# Patient Record
Sex: Male | Born: 1990 | Race: White | Hispanic: No | Marital: Single | Smoking: Never smoker
Health system: Southern US, Community
[De-identification: ages and names within clinical notes are randomized; demographics above are authoritative.]

## PROBLEM LIST (undated history)

## (undated) DIAGNOSIS — J45909 Unspecified asthma, uncomplicated: Secondary | ICD-10-CM

## (undated) HISTORY — PX: ABDOMINAL SURGERY: SHX537

---
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
## 2012-03-11 ENCOUNTER — Emergency Department: Payer: Self-pay | Admitting: Emergency Medicine

## 2012-03-23 LAB — URINALYSIS, COMPLETE
Blood: NEGATIVE
Hyaline Cast: 1
Ketone: NEGATIVE
Nitrite: NEGATIVE
Ph: 7 (ref 4.5–8.0)
Protein: NEGATIVE
RBC,UR: 1 /HPF (ref 0–5)
Specific Gravity: 1.015 (ref 1.003–1.030)

## 2012-03-23 LAB — CBC
HCT: 44.6 % (ref 40.0–52.0)
MCH: 31.1 pg (ref 26.0–34.0)
MCHC: 35.6 g/dL (ref 32.0–36.0)
MCV: 87 fL (ref 80–100)
Platelet: 191 10*3/uL (ref 150–440)
RDW: 12.8 % (ref 11.5–14.5)

## 2012-03-23 LAB — DRUG SCREEN, URINE
Amphetamines, Ur Screen: NEGATIVE (ref ?–1000)
Benzodiazepine, Ur Scrn: NEGATIVE (ref ?–200)
Cocaine Metabolite,Ur ~~LOC~~: NEGATIVE (ref ?–300)
MDMA (Ecstasy)Ur Screen: NEGATIVE (ref ?–500)
Tricyclic, Ur Screen: NEGATIVE (ref ?–1000)

## 2012-03-23 LAB — COMPREHENSIVE METABOLIC PANEL
Albumin: 4.4 g/dL (ref 3.8–5.6)
Anion Gap: 8 (ref 7–16)
Bilirubin,Total: 0.7 mg/dL (ref 0.2–1.0)
Calcium, Total: 9.1 mg/dL (ref 9.0–10.7)
Co2: 28 mmol/L (ref 21–32)
Creatinine: 0.86 mg/dL (ref 0.60–1.30)
EGFR (African American): >60
SGPT (ALT): 17 U/L (ref 12–78)
Total Protein: 8.2 g/dL (ref 6.4–8.6)

## 2012-03-23 LAB — ETHANOL
Ethanol %: <0.003 % (ref 0.000–0.080)
Ethanol: <3 mg/dL

## 2012-03-23 LAB — ACETAMINOPHEN LEVEL: Acetaminophen: <2 ug/mL

## 2012-03-23 LAB — SALICYLATE LEVEL: Salicylates, Serum: <1.7 mg/dL

## 2012-03-24 LAB — LIPID PANEL
Cholesterol: 142 mg/dL (ref 0–200)
HDL Cholesterol: 48 mg/dL (ref 40–60)
Ldl Cholesterol, Calc: 74 mg/dL (ref 0–100)
Triglycerides: 100 mg/dL (ref 0–135)
VLDL Cholesterol, Calc: 20 mg/dL (ref 5–40)

## 2012-03-25 ENCOUNTER — Inpatient Hospital Stay: Payer: Self-pay | Admitting: Psychiatry

## 2012-03-27 LAB — URINALYSIS, COMPLETE
Bacteria: NONE SEEN
Bilirubin,UR: NEGATIVE
Ketone: NEGATIVE
Leukocyte Esterase: NEGATIVE
Protein: NEGATIVE
RBC,UR: 1 /HPF (ref 0–5)
Specific Gravity: 1.006 (ref 1.003–1.030)
Squamous Epithelial: NONE SEEN
WBC UR: 2 /HPF (ref 0–5)

## 2014-05-31 ENCOUNTER — Emergency Department: Payer: Self-pay | Admitting: Emergency Medicine

## 2014-05-31 LAB — URINALYSIS, COMPLETE
BILIRUBIN, UR: NEGATIVE
BLOOD: NEGATIVE
Bacteria: NONE SEEN
Glucose,UR: NEGATIVE mg/dL (ref 0–75)
Ketone: NEGATIVE
NITRITE: NEGATIVE
Ph: 6 (ref 4.5–8.0)
Protein: NEGATIVE
RBC,UR: 50 /HPF (ref 0–5)
Specific Gravity: 1.024 (ref 1.003–1.030)
Squamous Epithelial: <1
WBC UR: 191 /HPF (ref 0–5)

## 2014-05-31 LAB — GC/CHLAMYDIA PROBE AMP

## 2014-09-09 NOTE — H&P (Signed)
PATIENT NAME:  Aaron Trevino, Deston MR#:  [id number] DATE OF BIRTH:  07/12/1990  DATE OF ADMISSION:  03/23/2012  IDENTIFYING INFORMATION AND CHIEF COMPLAINT: A 24 year old man brought in by the police.   CHIEF COMPLAINT: "I don't need to be here."   HISTORY OF PRESENT ILLNESS: Information obtained from the patient and from the chart. The nursing evaluation done when the patient first came to the emergency room states that he called 911 telling them that he was going to kill himself. It states the patient said that he was walking out into traffic, wanting to be hit by cars, that he felt depressed and confused. That he felt like he did not understand how life was working and wanted to die. The patient in the interview with me tells me that this is all untrue and that his mother made it up. He says that his mother just wants him to be apart from his girlfriend because she has some dislike of his girlfriend. He says that he had been crying earlier because he had problems and was feeling nervous. He does not think that he is very depressed. He totally denies having any suicidal ideation. He denies having any psychotic symptoms. He states that his sleep is irregular and chaotic. His appetite is fine. He denies that he is abusing any drugs. He is not currently working because he has a history of felony charges.   PAST PSYCHIATRIC HISTORY: He states that when he was much younger he had what he calls "temper tantrums" and a lot of outbursts. He was treated with Vyvanse for a while. He says when he did take it it helped him. He has not been on any medication for mood or anxiety otherwise. Never been in a psychiatric hospital. Denies ever trying to kill himself. Says he has gotten in fights when he was in jail but otherwise has not been physically aggressive.   SOCIAL HISTORY: The patient says he is living part time with his mother, part time with his aunt. He has a girlfriend who is [redacted] weeks pregnant. He is not currently  working. Says he has felony charges and is on probation.   MEDICAL HISTORY: No significant known ongoing medical problems. He said that he had mild asthma when he was a child but does not have problems from it anymore.   FAMILY HISTORY: Evidently there is an extensive family history of mental illness including schizophrenia.    SUBSTANCE ABUSE HISTORY: The patient says that he drinks alcohol only infrequently. Denies that he is abusing any other drugs. Denies any recent drug abuse.   CURRENT MEDICATIONS: None.   ALLERGIES: Clonidine.   REVIEW OF SYSTEMS: Says that his mood is currently angry. Denies suicidal or homicidal ideation. Denies hallucinations. Denies panic attacks. Is not reporting any acute physical symptoms at all.   MENTAL STATUS EXAMINATION: Somewhat agitated young man. Looks his stated age. Cooperative with the exam for the most part, although he frequently goes on tangents to insist that he does not need to be in the hospital. Eye contact is poor. Psychomotor activity is fidgety. Speech is normal in rate, tone, and volume. Affect is irritable, agitated, anxious. Mood is stated as being upset. Thoughts are a little bit tangential, not grossly bizarre. No evidence of delusional thinking. Denies auditory or visual hallucinations. He denies any suicidal or homicidal ideation. His intelligence appears to be probably average. Judgment and insight impaired.   PHYSICAL EXAMINATION:   GENERAL: The patient is a healthy-appearing young man.  Multiple tattoos but no acute skin lesions other than a small scab on his lip. He has pupils that are equal and reactive. Face is symmetric. Other than the small scab on the left side of his lip, no other facial abnormalities. Oropharynx looks normal.   EXTREMITIES: Has full range of motion at all extremities. Normal gait. Strength and reflexes normal throughout.   NEUROLOGICAL: Cranial nerves symmetric and intact.   LUNGS: Clear to auscultation.    HEART: Regular rate and rhythm.   ABDOMEN: Soft, nontender, normal bowel sounds.   VITAL SIGNS: Temperature 98.1, pulse 62, respirations 18, blood pressure 96/53.   LABORATORY RESULTS: His drug screen is negative. Alcohol undetectable. No significant abnormalities detected.   ASSESSMENT: This is a 24 year old man who presented earlier in the day with reports of depression, suicidal ideation, confusion. At this point when I am interviewing him he is denying all of this. He is very bent on wanting to get out of the hospital and blaming his problems on others. Not really clear what is exactly going on here, whether he does have a mood disorder or not. Involuntary paperwork is in place. The patient requires admission for evaluation of safety and symptoms.   TREATMENT PLAN: Admit to Psychiatry. P.r.n. medication for agitation and anxiety. Try and get collateral information if possible. Include him in groups and activities on the unit, work on ascertaining his need for further treatment. Defer other medication at this point.   DIAGNOSIS PRINCIPLE AND PRIMARY:  AXIS I: Depression, not otherwise specified.   SECONDARY DIAGNOSES:  AXIS I: No other.  AXIS II: Deferred.  AXIS III: No diagnosis.  AXIS IV: Moderate to severe from having felony charges, lack of resources.  AXIS V: Functioning at time of evaluation is 40.   ____________________________ Audery Amel, MD jtc:vtd D: 03/23/2012 21:26:28 ET T: 03/24/2012 06:49:41 ET JOB#: [id number]  cc: Audery Amel, MD, <Dictator> Audery Amel MD ELECTRONICALLY SIGNED 03/24/2012 9:41

## 2014-09-09 NOTE — Discharge Summary (Signed)
PATIENT NAME:  Aaron Trevino, HORNSTEIN MR#:  [id number] DATE OF BIRTH:  [birth year]  DATE OF ADMISSION:  03/25/2012 DATE OF DISCHARGE:  03/27/2012  HOSPITAL COURSE: See dictated History and Physical for details of admission. This 24 year old man was admitted through the Emergency Room. He had presented initially with depressed mood and possible suicidal ideation. When he was informed that he was not able to leave the Emergency Room he became agitated. He spent the weekend in the Emergency Room because of his agitation and resistance to treatment. After the weekend he eventually calmed down and became more reasonable. He has been admitted to the psychiatry ward where he has not engaged in any dangerous or aggressive or violent behavior. He states that he still has had a depressed mood, which he relates to a lot of  the stress going on in his life, but has denied any suicidal ideation. He has not engaged in any suicidal behavior. He does not show signs of being psychotic. He was treated with Seroquel for several days in an attempt to greatly calm down his mood. Because of his lack of finances he will not be able to afford this medication long-term. This has been discontinued and he was switched over to Celexa 20 mg a day for treatment of depression and he has tolerated that well. He has been educated about the treatment of depression including therapy and staying on medication. He has been supported in staying off of drugs and alcohol. He will be followed up at [facility] where an outpatient appointment is being made. Medically he really has had no complaints. A urinalysis suggested the possibility of an infection and he is being treated with Septra, which will continue for two more days.   DISCHARGE MEDICATIONS:  1. Celexa 20 mg per day.  2. Septra DS 1 tablet twice a day for two more days.   LABORATORY RESULTS: Admission labs showed a chemistry panel with an elevated glucose at 120 but it was not a fasting level.  Alkaline phosphatase low at 96. Alcohol undetectable. TSH normal at 2.55. Drug screen negative. Lipid panel shows a cholesterol total 142, triglycerides 100, HDL 48. CBC was normal. Urinalysis showed 15 white blood cells to 1 red blood cell, no bacteria, and trace leukocyte esterase. The salicylates and acetaminophen were negative. Follow-up urinalysis today is negative.   MENTAL STATUS EXAM: Casually dressed and groomed young man who looks his stated age. Appears to be in no acute distress. Eye contact still diminished. Speech quiet but easy to understand and articulate. Affect still a little bit blunted. Mood stated as being fine. Thoughts are lucid with no indication of loosening of associations or delusional thinking. Denies auditory or visual hallucinations. Denies suicidal or homicidal ideation. Judgment and insight are improved. Baseline intelligence probably normal. Short and long-term memory grossly intact. No sign of disorganized or psychotic thinking. Denies any suicidal thoughts at all.   DISPOSITION: Discharged back home and follow up with [facility].   DIAGNOSIS PRINCIPLE AND PRIMARY:  AXIS I: Depression, not otherwise specified.   SECONDARY DIAGNOSES:  AXIS I: No further.   AXIS II: Deferred.   AXIS III: Urinary tract infection.   AXIS IV: Moderate to severe from legal charges, being out of work, having no stable place to stay.   AXIS V: Functioning at time of discharge: 57.   ____________________________ Audery Amel, MD jtc:bjt D: 03/27/2012 12:27:47 ET T: 03/27/2012 12:40:05 ET JOB#: [id number]  cc: Audery Amel, MD, <Dictator> Audery Amel MD  ELECTRONICALLY SIGNED 03/27/2012 16:37

## 2014-09-09 NOTE — Consult Note (Signed)
Brief Consult Note: Diagnosis: depression nos.   Patient was seen by consultant.   Recommend further assessment or treatment.   Comments: Psychiatry: Patient seen. Depression nos. Behavior problems. Under IVC. Has been admitted to psychiatry. Waiting for behavior to calm down a bit for the safety of the unit.  Electronic Signatures: Audery Amellapacs, Dayani Winbush T (MD)  (Signed [phone]-Nov-13 20:56)  Authored: Brief Consult Note   Last Updated: 01-Nov-13 20:56 by Audery Amellapacs, Senica Crall T (MD)

## 2016-05-08 ENCOUNTER — Emergency Department
Admission: EM | Admit: 2016-05-08 | Discharge: 2016-05-08 | Disposition: A | Discharge status: Home / Self Care | Payer: Self-pay | Attending: Emergency Medicine | Admitting: Emergency Medicine

## 2016-05-08 DIAGNOSIS — Z5321 Procedure and treatment not carried out due to patient leaving prior to being seen by health care provider: Secondary | ICD-10-CM | POA: Insufficient documentation

## 2016-09-26 ENCOUNTER — Ambulatory Visit (INDEPENDENT_AMBULATORY_CARE_PROVIDER_SITE_OTHER): Payer: BC Managed Care – PPO

## 2016-09-26 ENCOUNTER — Encounter: Payer: Self-pay | Admitting: Emergency Medicine

## 2016-09-26 ENCOUNTER — Ambulatory Visit
Admission: EM | Admit: 2016-09-26 | Discharge: 2016-09-26 | Disposition: A | Discharge status: Home / Self Care | Payer: BC Managed Care – PPO | Attending: Family Medicine | Admitting: Family Medicine

## 2016-09-26 DIAGNOSIS — S20212A Contusion of left front wall of thorax, initial encounter: Secondary | ICD-10-CM

## 2016-09-26 DIAGNOSIS — W19XXXA Unspecified fall, initial encounter: Secondary | ICD-10-CM

## 2016-09-26 HISTORY — DX: Unspecified asthma, uncomplicated: J45.909

## 2016-09-26 MED ORDER — TRAMADOL HCL 50 MG PO TABS: 50.0000 mg | ORAL_TABLET | Freq: Three times a day (TID) | ORAL | 0 refills | Status: DC | PRN

## 2016-09-26 NOTE — Discharge Instructions (Signed)
Take medication as prescribed. Rest. Drink plenty of fluids. Deep breaths as discussed.  ° °Follow up with your primary care physician this week as needed. Return to Urgent care for new or worsening concerns.  ° °

## 2016-09-26 NOTE — ED Provider Notes (Signed)
[facility] ____________________________________________  Time seen: Approximately 5:13 PM  I have reviewed the triage vital signs and the nursing notes.   HISTORY  Chief Complaint Rib Pain (left side)   HPI Harold Willis is a 26 y.o. male presenting for evaluation of left lateral rib pain post injury that occurred 2 days ago. Patient reports Friday evening he was at a concert and "crowd surfing "and reports that he was dropped. Patient states when he fell on his left side his left elbow hit into his left ribs causing onset of pain. Denies other injuries. Denies head injury or loss consciousness. Reports he was drinking alcohol, denies intoxication. Reports pain is present with direct palpation or overhead movement. denies any resting pain. States no pain prior to injury. Denies chest pain. Denies shortness of breath. States occasionally does have some pain with very deep inhalation. Denies other pain with breathing. Denies previous rib fractures are listed issues. Reports has had history of asthma and has been well controlled. Steady recent fevers, cough, congestion or sickness. Again denies any other pain or injury. Reports pain is primarily at night when trying to lie down and get comfortable. States pain currently mild.  Denies chest pain, shortness of breath, abdominal pain, dysuria,or rash. Denies recent sickness. Denies recent antibiotic use.    Past Medical History:  Diagnosis Date  . Asthma     There are no active problems to display for this patient.   History reviewed. No pertinent surgical history.   No current facility-administered medications for this encounter.   Current Outpatient Prescriptions:  .  albuterol (PROVENTIL HFA;VENTOLIN HFA) 108 (90 Base) MCG/ACT inhaler, Inhale 2 puffs into the lungs every 4 (four) hours as needed for wheezing or shortness of breath., Disp: , Rfl:  .  fluticasone furoate-vilanterol (BREO ELLIPTA) 200-25 MCG/INH AEPB,  Inhale 1 puff into the lungs daily., Disp: , Rfl:  .  tamsulosin (FLOMAX) 0.4 MG CAPS capsule, Take 0.4 mg by mouth daily., Disp: , Rfl:  .  traMADol (ULTRAM) 50 MG tablet, Take 1 tablet (50 mg total) by mouth every 8 (eight) hours as needed (Do not drive or operate machinery while taking as can cause drowsiness.)., Disp: 12 tablet, Rfl: 0  Allergies Doxycycline and Suprax [cefixime]  History reviewed. No pertinent family history.  Social History Social History  Substance Use Topics  . Smoking status: Never Smoker  . Smokeless tobacco: Never Used  . Alcohol use Yes    Review of Systems Constitutional: No fever/chills Eyes: No visual changes. Cardiovascular: Denies chest pain. Respiratory: Denies shortness of breath. Gastrointestinal: No abdominal pain.  No nausea, no vomiting.  No diarrhea.  No constipation. No hematuria, melena or abnormal colored stools.  Genitourinary: Negative for dysuria. Musculoskeletal: Negative for back pain. Skin: Negative for rash. Neurological: Negative for headaches, focal weakness or numbness.  ____________________________________________   PHYSICAL EXAM:  VITAL SIGNS: ED Triage Vitals  Enc Vitals Group     BP 09/26/16 1527 132/83     Pulse Rate 09/26/16 1527 74     Resp 09/26/16 1527 16     Temp 09/26/16 1527 97.8 F (36.6 C)     Temp Source 09/26/16 1527 Oral     SpO2 09/26/16 1527 98 %     Weight 09/26/16 1524 220 lb (99.8 kg)     Height 09/26/16 1524 5\' 9"  (1.753 m)     Head Circumference --      Peak Flow --      Pain Score  09/26/16 1525 6     Pain Loc --      Pain Edu? --      Excl. in GC? --     Constitutional: Alert and oriented. Well appearing and in no acute distress. ENT      Head: Normocephalic and atraumatic. Cardiovascular: Normal rate, regular rhythm. Grossly normal heart sounds.  Good peripheral circulation. Respiratory: Normal respiratory effort without tachypnea nor retractions. Breath sounds are clear and equal  bilaterally. No wheezes, rales, rhonchi. Gastrointestinal: Soft and nontender. No distention. Normal Bowel sounds. No CVA tenderness. Musculoskeletal:  Nontender with normal range of motion in all extremities. No midline cervical, thoracic or lumbar tenderness to palpation.  Except: Left lateral mid ribs mild to moderate tenderness to direct palpation, minimal ecchymosis, no swelling, no palpable rib fracture, no crepitus, no other tenderness noted. Neurologic:  Normal speech and language. No gross focal neurologic deficits are appreciated. Speech is normal. No gait instability.  Skin:  Skin is warm, dry. Psychiatric: Mood and affect are normal. Speech and behavior are normal. Patient exhibits appropriate insight and judgment  ___________________________________________   LABS (all labs ordered are listed, but only abnormal results are displayed)  Labs Reviewed - No data to display ____________________________________________  RADIOLOGY  No results found. ____________________________________________   PROCEDURES Procedures    INITIAL IMPRESSION / ASSESSMENT AND PLAN / ED COURSE  Pertinent labs & imaging results that were available during my care of the patient were reviewed by me and considered in my medical decision making (see chart for details).  Well-appearing patient. No acute distress. Left lateral rib pain fully reproducible by direct palpation. Left rib x-ray negative per radiologist. Discussed the patient suspect rib contusion. Encouraged supportive care, over-the-counter ibuprofen, rest, ice. Avoidance of strenuous activity. Encourage deep breaths throughout the day. Also will treat patient with when necessary tramadol as needed for breakthrough pain.Discussed indication, risks and benefits of medications with patient.   [place] controlled substance database reviewed for last one year, and no controlled medications documented in last one year.   Discussed follow up  with Primary care physician this week. Discussed follow up and return parameters including no resolution or any worsening concerns. Patient verbalized understanding and agreed to plan.   ____________________________________________   FINAL CLINICAL IMPRESSION(S) / ED DIAGNOSES  Final diagnoses:  Rib contusion, left, initial encounter     Discharge Medication List as of 09/26/2016  4:03 PM    START taking these medications   Details  traMADol (ULTRAM) 50 MG tablet Take 1 tablet (50 mg total) by mouth every 8 (eight) hours as needed (Do not drive or operate machinery while taking as can cause drowsiness.)., Starting Mon 09/26/2016, Print        Note: This dictation was prepared with Dragon dictation along with smaller phrase technology. Any transcriptional errors that result from this process are unintentional.         Renford Dills, NP 09/30/16 1325

## 2016-09-26 NOTE — ED Triage Notes (Signed)
Patient c/o left sided rib pain that started Friday night.  Patient states that he fell at a concert on Friday.

## 2017-08-06 ENCOUNTER — Emergency Department (HOSPITAL_COMMUNITY)
Admission: EM | Admit: 2017-08-06 | Discharge: 2017-08-06 | Disposition: A | Discharge status: Home / Self Care | Payer: Self-pay | Attending: Emergency Medicine | Admitting: Emergency Medicine

## 2017-08-06 ENCOUNTER — Encounter (HOSPITAL_COMMUNITY): Payer: Self-pay

## 2017-08-06 DIAGNOSIS — Z5321 Procedure and treatment not carried out due to patient leaving prior to being seen by health care provider: Secondary | ICD-10-CM | POA: Insufficient documentation

## 2017-08-06 DIAGNOSIS — R110 Nausea: Secondary | ICD-10-CM | POA: Insufficient documentation

## 2017-08-06 DIAGNOSIS — R000 Tachycardia, unspecified: Secondary | ICD-10-CM | POA: Insufficient documentation

## 2017-08-06 NOTE — ED Notes (Signed)
[facility] Expected date:  Expected time:  Means of arrival:  Comments: 27 yo Nausea, tachycardia

## 2017-08-06 NOTE — ED Provider Notes (Signed)
By the time I got to the patient's bed, nurse informed me that pt had received 500 cc bag of fluid from EMS and felt better, and eloped.   Derwood KaplanNanavati, Kalifa Cadden, MD 08/06/17 [phone]

## 2017-08-06 NOTE — ED Notes (Addendum)
Pt attempted to leave through EMS bay. Security and [employer] was called. Pt came back to charge RN desk where pt IV was removed. Pt denies pain and sts that the medicine and fluid he was given by EMS helped his nausea. Pt was advised that the physician is ready to see him, but pt refused to be seen. Pt left ED with security. Pt ambulates with no difficulty and in NAD

## 2017-08-06 NOTE — ED Notes (Addendum)
Patient kept asking to leave and I explained to the patient the doctor would be there shortly. Patient began to walk down hallway and I asked patient to come back and he continued to walk with her IV still in his arm. Patient continued walking to the EMS bay and I paged security to come get him. Security escorted him back in and Susan,RN was able to remove the IV. Patient left without being seen by the doctor. Patient would not stay. Patient left with out medical advice.

## 2017-08-06 NOTE — ED Notes (Signed)
Patient observed walking out of the emergency department with his IV still in place. This RN politely asked patient to return to his bed so that the IV could be taken out. Patient did not listen to this RN and security was called to the area. Patient continued to walk away from this RN and security greeted patient outside in the EMS bay. Patient complied and came back inside to have his IV taken out by the charge nurse. Patient then left AMA.

## 2017-08-06 NOTE — ED Triage Notes (Signed)
Patient arrived via [facility] from home. Patient was out drinking lastnight and woke up this morning with non stop vomiting with a little blood tinged per patien. Patient was tachy initially 130-140 bpm. Initial bp- 104/60. Initial cbg-60. 99% on room air. RR-18. 500 NSgiven and 25 grams of D-10 given per ems and 4 mg of zofran given per ems. HR is now 120 and CBG is up 211. Bp-130/84. 18g in left bicep. Patient has not vomited since the zofran.

## 2017-12-20 ENCOUNTER — Emergency Department
Admission: EM | Admit: 2017-12-20 | Discharge: 2017-12-20 | Discharge status: Against Medical Advice | Payer: Self-pay | Attending: Emergency Medicine | Admitting: Emergency Medicine

## 2017-12-20 ENCOUNTER — Other Ambulatory Visit: Payer: Self-pay

## 2017-12-20 DIAGNOSIS — L02612 Cutaneous abscess of left foot: Secondary | ICD-10-CM | POA: Insufficient documentation

## 2017-12-20 DIAGNOSIS — Z5321 Procedure and treatment not carried out due to patient leaving prior to being seen by health care provider: Secondary | ICD-10-CM | POA: Insufficient documentation

## 2017-12-20 NOTE — ED Triage Notes (Signed)
Pt arrives with abscess/boil to L top of foot at base of 4th toe. States been there a few weeks. No fevers or drainage. Alert, oriented, ambulatory. No redness noted around site.

## 2017-12-20 NOTE — ED Notes (Signed)
Patient made comment to house keeping that he wanted to be d/c. PA went into room for assessment and room was empty. Patient left without being seen by PA

## 2018-03-26 ENCOUNTER — Emergency Department
Admission: EM | Admit: 2018-03-26 | Discharge: 2018-03-26 | Disposition: A | Discharge status: Home / Self Care | Payer: Self-pay | Attending: Emergency Medicine | Admitting: Emergency Medicine

## 2018-03-26 ENCOUNTER — Encounter: Payer: Self-pay | Admitting: Intensive Care

## 2018-03-26 ENCOUNTER — Emergency Department: Payer: Self-pay

## 2018-03-26 ENCOUNTER — Other Ambulatory Visit: Payer: Self-pay

## 2018-03-26 DIAGNOSIS — B353 Tinea pedis: Secondary | ICD-10-CM | POA: Insufficient documentation

## 2018-03-26 DIAGNOSIS — L02612 Cutaneous abscess of left foot: Secondary | ICD-10-CM | POA: Insufficient documentation

## 2018-03-26 DIAGNOSIS — F17210 Nicotine dependence, cigarettes, uncomplicated: Secondary | ICD-10-CM | POA: Insufficient documentation

## 2018-03-26 DIAGNOSIS — L0291 Cutaneous abscess, unspecified: Secondary | ICD-10-CM

## 2018-03-26 MED ORDER — CLINDAMYCIN PHOSPHATE 300 MG/2ML IJ SOLN
INTRAMUSCULAR | Status: AC
  Administered 2018-03-26: 600 mg via INTRAMUSCULAR
  Filled 2018-03-26: qty 4

## 2018-03-26 MED ORDER — CLINDAMYCIN PHOSPHATE 600 MG/4ML IJ SOLN
600.0000 mg | Freq: Once | INTRAMUSCULAR | Status: AC
  Administered 2018-03-26: 600 mg via INTRAMUSCULAR
  Filled 2018-03-26: qty 4

## 2018-03-26 MED ORDER — TERBINAFINE HCL 250 MG PO TABS: 250.0000 mg | ORAL_TABLET | Freq: Every day | ORAL | 0 refills | Status: AC

## 2018-03-26 MED ORDER — CLINDAMYCIN HCL 300 MG PO CAPS: 300.0000 mg | ORAL_CAPSULE | Freq: Four times a day (QID) | ORAL | 0 refills | Status: AC

## 2018-03-26 MED ORDER — LIDOCAINE HCL (PF) 1 % IJ SOLN
5.0000 mL | Freq: Once | INTRAMUSCULAR | Status: AC
  Administered 2018-03-26: 5 mL via INTRADERMAL
  Filled 2018-03-26: qty 5

## 2018-03-26 MED ORDER — OXYCODONE-ACETAMINOPHEN 5-325 MG PO TABS
1.0000 | ORAL_TABLET | ORAL | Status: DC | PRN
  Administered 2018-03-26: 1 via ORAL
  Filled 2018-03-26: qty 1
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
## 2018-03-26 NOTE — ED Notes (Addendum)
See triage note  States he was seen about 1 month ago for blister on left foot  States area popped was placed on meds  Then 2 days ago developed more pain

## 2018-11-30 ENCOUNTER — Telehealth: Payer: Self-pay

## 2018-11-30 ENCOUNTER — Other Ambulatory Visit: Payer: BC Managed Care – PPO

## 2018-11-30 ENCOUNTER — Ambulatory Visit: Payer: Self-pay

## 2018-11-30 DIAGNOSIS — Z20822 Contact with and (suspected) exposure to covid-19: Secondary | ICD-10-CM

## 2018-11-30 NOTE — Telephone Encounter (Signed)
See telephone note.

## 2018-11-30 NOTE — Telephone Encounter (Signed)
Glenda with [facility] Dept., request COVID 19 test for symptoms. Scheduled for today.

## 2018-12-06 LAB — NOVEL CORONAVIRUS, NAA: SARS-CoV-2, NAA: NOT DETECTED

## 2019-04-13 ENCOUNTER — Other Ambulatory Visit: Payer: Self-pay

## 2019-04-13 ENCOUNTER — Encounter: Payer: Self-pay | Admitting: Emergency Medicine

## 2019-04-13 ENCOUNTER — Emergency Department
Admission: EM | Admit: 2019-04-13 | Discharge: 2019-04-13 | Disposition: A | Discharge status: Home / Self Care | Payer: Self-pay | Attending: Student in an Organized Health Care Education/Training Program | Admitting: Student in an Organized Health Care Education/Training Program

## 2019-04-13 DIAGNOSIS — M7918 Myalgia, other site: Secondary | ICD-10-CM

## 2019-04-13 DIAGNOSIS — F17210 Nicotine dependence, cigarettes, uncomplicated: Secondary | ICD-10-CM | POA: Insufficient documentation

## 2019-04-13 DIAGNOSIS — Y93I9 Activity, other involving external motion: Secondary | ICD-10-CM | POA: Diagnosis not present

## 2019-04-13 DIAGNOSIS — M791 Myalgia, unspecified site: Secondary | ICD-10-CM | POA: Diagnosis not present

## 2019-04-13 DIAGNOSIS — Y92410 Unspecified street and highway as the place of occurrence of the external cause: Secondary | ICD-10-CM | POA: Insufficient documentation

## 2019-04-13 DIAGNOSIS — Y999 Unspecified external cause status: Secondary | ICD-10-CM | POA: Diagnosis not present

## 2019-04-13 MED ORDER — IBUPROFEN 600 MG PO TABS: 600.0000 mg | ORAL_TABLET | Freq: Three times a day (TID) | ORAL | 0 refills | Status: DC | PRN

## 2019-04-13 MED ORDER — CYCLOBENZAPRINE HCL 10 MG PO TABS: 10.0000 mg | ORAL_TABLET | Freq: Three times a day (TID) | ORAL | 0 refills | Status: DC | PRN

## 2019-04-13 MED ORDER — TRAMADOL HCL 50 MG PO TABS: 50.0000 mg | ORAL_TABLET | Freq: Four times a day (QID) | ORAL | 0 refills | Status: AC | PRN

## 2019-04-13 NOTE — Discharge Instructions (Addendum)
Follow discharge care instruction take medication as directed.  Be advised of drowsy effect of these medications. °
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
## 2019-04-14 ENCOUNTER — Encounter: Payer: Self-pay | Admitting: Emergency Medicine

## 2019-04-14 ENCOUNTER — Emergency Department: Payer: Self-pay

## 2019-04-14 ENCOUNTER — Other Ambulatory Visit: Payer: Self-pay

## 2019-04-14 ENCOUNTER — Emergency Department
Admission: EM | Admit: 2019-04-14 | Discharge: 2019-04-14 | Disposition: A | Discharge status: Home / Self Care | Payer: Self-pay | Attending: Emergency Medicine | Admitting: Emergency Medicine

## 2019-04-14 DIAGNOSIS — Y9389 Activity, other specified: Secondary | ICD-10-CM | POA: Diagnosis not present

## 2019-04-14 DIAGNOSIS — M79622 Pain in left upper arm: Secondary | ICD-10-CM | POA: Insufficient documentation

## 2019-04-14 DIAGNOSIS — F17210 Nicotine dependence, cigarettes, uncomplicated: Secondary | ICD-10-CM | POA: Insufficient documentation

## 2019-04-14 DIAGNOSIS — M25512 Pain in left shoulder: Secondary | ICD-10-CM | POA: Diagnosis not present

## 2019-04-14 DIAGNOSIS — Y999 Unspecified external cause status: Secondary | ICD-10-CM | POA: Diagnosis not present

## 2019-04-14 DIAGNOSIS — Y92410 Unspecified street and highway as the place of occurrence of the external cause: Secondary | ICD-10-CM | POA: Insufficient documentation

## 2019-04-14 MED ORDER — KETOROLAC TROMETHAMINE 60 MG/2ML IM SOLN
30.0000 mg | Freq: Once | INTRAMUSCULAR | Status: AC
  Administered 2019-04-14: 19:00:00 30 mg via INTRAMUSCULAR
  Filled 2019-04-14: qty 2

## 2019-04-14 NOTE — ED Notes (Signed)
Patient transported to X-ray 
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
## 2019-08-16 IMAGING — DX DG FOOT COMPLETE 3+V*L*
3 series · 3 of 3 positions shown · non-contrast
Comparison: None.

CLINICAL DATA: 27-year-old male with left foot pain and
discoloration at the base of the fourth and fifth digits.

EXAM:
LEFT FOOT - COMPLETE 3+ VIEW

[foot ap]
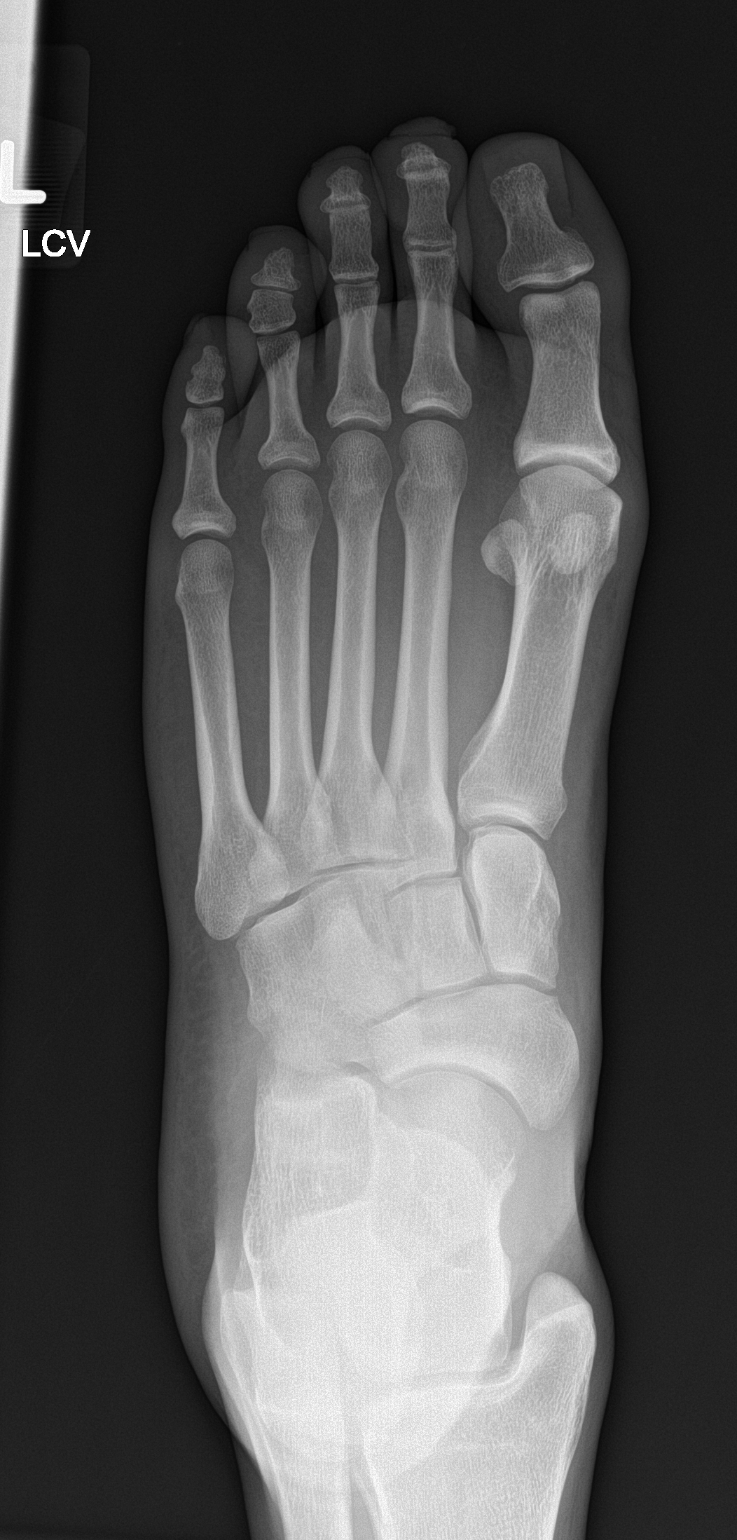

[foot obl]
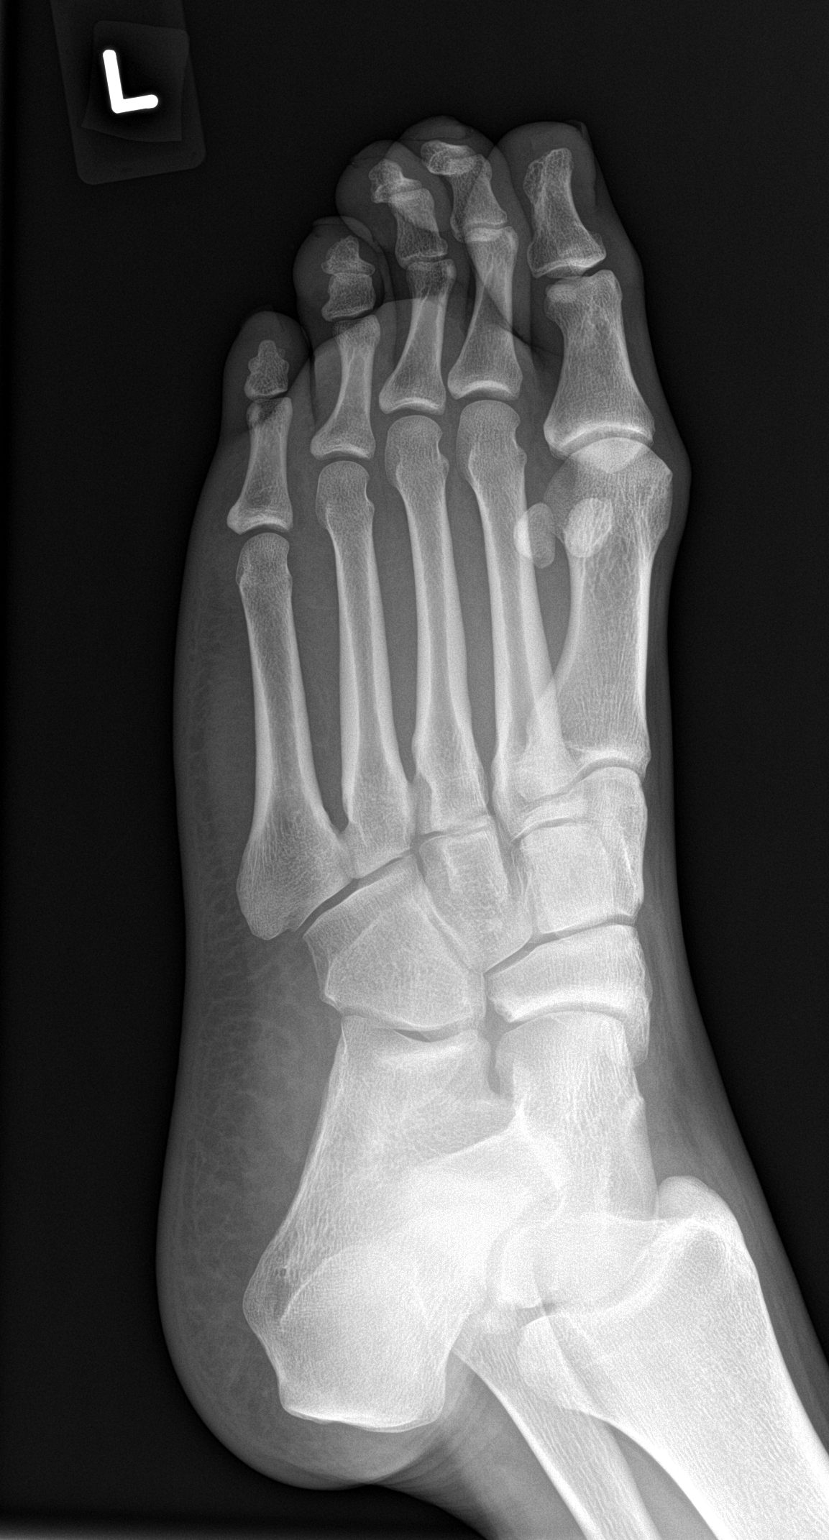

[foot lat]
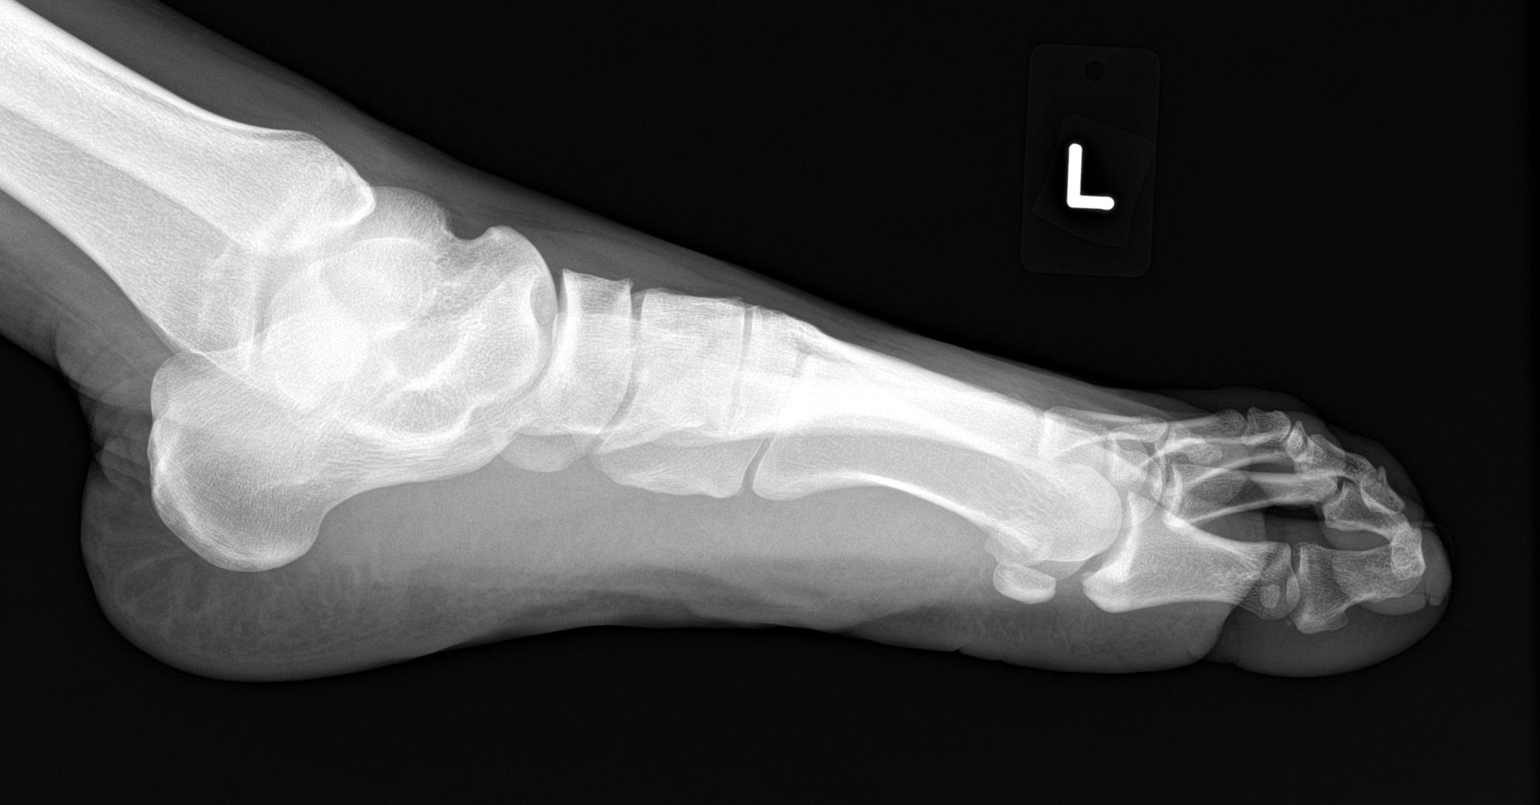

[3 of 3 positions shown; findings below may reference images not displayed]

FINDINGS: There is no evidence of fracture or dislocation. There is no
evidence of arthropathy or other focal bone abnormality. Soft
tissues are unremarkable. No radiopaque retained foreign body.
IMPRESSION: Negative.
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
## 2021-06-30 ENCOUNTER — Other Ambulatory Visit: Payer: Self-pay | Admitting: Family Medicine

## 2021-06-30 DIAGNOSIS — R1032 Left lower quadrant pain: Secondary | ICD-10-CM

## 2021-07-15 ENCOUNTER — Ambulatory Visit: Payer: Self-pay

## 2021-10-14 ENCOUNTER — Ambulatory Visit (INDEPENDENT_AMBULATORY_CARE_PROVIDER_SITE_OTHER): Payer: 59 | Admitting: Urology

## 2021-10-14 VITALS — BP 143/93 | HR 89 | Ht 69.0 in | Wt 250.0 lb

## 2021-10-14 DIAGNOSIS — N419 Inflammatory disease of prostate, unspecified: Secondary | ICD-10-CM

## 2021-10-14 DIAGNOSIS — N411 Chronic prostatitis: Secondary | ICD-10-CM

## 2021-10-14 LAB — URINALYSIS, COMPLETE
Bilirubin, UA: NEGATIVE
Glucose, UA: NEGATIVE
Ketones, UA: NEGATIVE
Leukocytes,UA: NEGATIVE
Nitrite, UA: NEGATIVE
Protein,UA: NEGATIVE
RBC, UA: NEGATIVE
Specific Gravity, UA: 1.03 (ref 1.005–1.030)
Urobilinogen, Ur: 0.2 mg/dL (ref 0.2–1.0)
pH, UA: 6 (ref 5.0–7.5)

## 2021-10-14 LAB — MICROSCOPIC EXAMINATION: Bacteria, UA: NONE SEEN

## 2021-10-14 MED ORDER — TAMSULOSIN HCL 0.4 MG PO CAPS: 0.4000 mg | ORAL_CAPSULE | Freq: Every day | ORAL | 4 refills | Status: DC

## 2021-10-14 MED ORDER — MELOXICAM 7.5 MG PO TABS: 7.5000 mg | ORAL_TABLET | Freq: Every day | ORAL | 0 refills | Status: DC

## 2021-10-14 NOTE — Progress Notes (Signed)
10/14/2021 9:43 AM   [name] Aug 10, 1990 [id number]  Referring provider:  Marisue Ivan, MD [phone] [facility] [address] [facility] [place] [facility] [place],  [place] [address] Chief Complaint  Patient presents with   Prostatitis      HPI: Harold Willis is a 31 y.o.male who presents today for further evaluation of chronic prostatitis.   He has a history of STI that progressed into prostatitis.   He was last seen by urology in 2018 by Dr. Achilles Dunk. At time he had significant improvement in the orchialgia and voiding symptoms. He was using alpha-blocked and tolerated it well.   He reports today that he has a history of tightness in his prostate area that was ongoing and he followed up with Dr Achilles Dunk and was told his had prostatitis. He was on Flomax which had slight improvement. He had lower abdominal pain last month he was having diarrhea he did not have much urinary symptoms. He is most bothered by pressure and pain when he sits.   He reports split and weak stream as well as difficulty urinating.    PMH: Past Medical History:  Diagnosis Date   Asthma     Surgical History: No past surgical history on file.  Home Medications:  Allergies as of 10/14/2021       Reactions   Iodinated Contrast Media Hives, Swelling   Hives, itching and upper lip swelling    Other Hives, Rash   Doxycycline Hives   Suprax [cefixime] Hives        Medication List        Accurate as of Oct 14, 2021  9:43 AM. If you have any questions, ask your nurse or doctor.          STOP taking these medications    fluticasone furoate-vilanterol 200-25 MCG/INH Aepb Commonly known as: BREO ELLIPTA   tamsulosin 0.4 MG Caps capsule Commonly known as: FLOMAX   traMADol 50 MG tablet Commonly known as: Ultram       TAKE these medications    albuterol 108 (90 Base) MCG/ACT inhaler Commonly known as: VENTOLIN HFA Inhale 2 puffs into the lungs every 4 (four) hours as needed for wheezing or shortness of  breath.        Allergies:  Allergies  Allergen Reactions   Iodinated Contrast Media Hives and Swelling    Hives, itching and upper lip swelling     Other Hives and Rash   Doxycycline Hives   Suprax [Cefixime] Hives    Family History: No family history on file.  Social History:  reports that he has never smoked. He has never used smokeless tobacco. He reports current alcohol use. No history on file for drug use.   Physical Exam: BP (!) 143/93   Pulse 89   Ht 5\' 9"  (1.753 m)   Wt 250 lb (113.4 kg)   BMI 36.92 kg/m   Constitutional:  Alert and oriented, No acute distress. HEENT: [place] AT, moist mucus membranes.  Trachea midline, no masses. Cardiovascular: No clubbing, cyanosis, or edema. Respiratory: Normal respiratory effort, no increased work of breathing. Skin: No rashes, bruises or suspicious lesions. Neurologic: Grossly intact, no focal deficits, moving all 4 extremities. Psychiatric: Normal mood and affect.  Laboratory Data: Urinalysis Unremarkable   Assessment & Plan:    Chronic prostatitis  -  He had slight improvement on Flomax in the past. Will have him start back on this medication on a prn bases. We discussed the side effects of Flomax including retrograde  ejaculation.  - Will also have him take an anti-inflammatory; meloxicam when he is having flares, hold for GI distress - He can take both of these as needed.  - Flomax 0.4 mg as needed; prescribed  - Meloxicam  7.5mg   as needed; prescirbed  -If he fails to improve or episodes of pelvic pain and/or urinary symptoms increase in frequency, could consider pelvic floor therapy as there may be a component of pelvic floor dysfunction contributing to his symptoms based on his history -No evidence of infectious etiology, urinalysis negative -We did discuss dietary triggers as well today as there may also be a component of IC  F/u prn  I,Kailey Littlejohn,acting as a scribe for Vanna Scotland, MD.,have documented  all relevant documentation on the behalf of Vanna Scotland, MD,as directed by  Vanna Scotland, MD while in the presence of Vanna Scotland, MD.  I have reviewed the above documentation for accuracy and completeness, and I agree with the above.   Vanna Scotland, MD   [facility] [address], [address] [place], [place] [address] [phone]

## 2022-04-07 ENCOUNTER — Other Ambulatory Visit: Payer: Self-pay

## 2022-04-07 ENCOUNTER — Emergency Department
Admission: EM | Admit: 2022-04-07 | Discharge: 2022-04-07 | Disposition: A | Discharge status: Home / Self Care | Payer: Self-pay | Attending: Emergency Medicine | Admitting: Emergency Medicine

## 2022-04-07 ENCOUNTER — Encounter: Payer: Self-pay | Admitting: Emergency Medicine

## 2022-04-07 DIAGNOSIS — X58XXXA Exposure to other specified factors, initial encounter: Secondary | ICD-10-CM | POA: Insufficient documentation

## 2022-04-07 DIAGNOSIS — S3121XA Laceration without foreign body of penis, initial encounter: Secondary | ICD-10-CM | POA: Insufficient documentation

## 2022-04-07 MED ORDER — LIDOCAINE 3 % EX CREA: 1.0000 | TOPICAL_CREAM | Freq: Two times a day (BID) | CUTANEOUS | 0 refills | Status: AC | PRN
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
## 2022-04-23 ENCOUNTER — Emergency Department
Admission: EM | Admit: 2022-04-23 | Discharge: 2022-04-23 | Discharge status: Against Medical Advice | Payer: Self-pay | Attending: Emergency Medicine | Admitting: Emergency Medicine

## 2022-04-23 DIAGNOSIS — Z5321 Procedure and treatment not carried out due to patient leaving prior to being seen by health care provider: Secondary | ICD-10-CM | POA: Insufficient documentation

## 2022-04-23 DIAGNOSIS — Z041 Encounter for examination and observation following transport accident: Secondary | ICD-10-CM | POA: Insufficient documentation
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
## 2022-12-15 ENCOUNTER — Ambulatory Visit
Admission: RE | Admit: 2022-12-15 | Discharge: 2022-12-15 | Disposition: A | Discharge status: Home / Self Care | Payer: Managed Care, Other (non HMO) | Source: Ambulatory Visit | Attending: Physician Assistant | Admitting: Physician Assistant

## 2022-12-15 ENCOUNTER — Other Ambulatory Visit: Payer: Self-pay | Admitting: Physician Assistant

## 2022-12-15 DIAGNOSIS — M7989 Other specified soft tissue disorders: Secondary | ICD-10-CM

## 2022-12-15 DIAGNOSIS — M79604 Pain in right leg: Secondary | ICD-10-CM | POA: Insufficient documentation

## 2024-01-05 ENCOUNTER — Encounter: Payer: Self-pay | Admitting: Emergency Medicine

## 2024-01-05 ENCOUNTER — Ambulatory Visit
Admission: EM | Admit: 2024-01-05 | Discharge: 2024-01-05 | Disposition: A | Discharge status: Home / Self Care | Attending: Nurse Practitioner | Admitting: Nurse Practitioner

## 2024-01-05 ENCOUNTER — Other Ambulatory Visit: Payer: Self-pay

## 2024-01-05 DIAGNOSIS — N4889 Other specified disorders of penis: Secondary | ICD-10-CM

## 2024-01-05 DIAGNOSIS — N489 Disorder of penis, unspecified: Secondary | ICD-10-CM

## 2024-01-05 LAB — POCT URINE DIPSTICK
Bilirubin, UA: NEGATIVE
Blood, UA: NEGATIVE
Glucose, UA: NEGATIVE mg/dL
Ketones, POC UA: NEGATIVE mg/dL
Leukocytes, UA: NEGATIVE
Nitrite, UA: NEGATIVE
Protein Ur, POC: NEGATIVE mg/dL
Spec Grav, UA: 1.015 (ref 1.010–1.025)
Urobilinogen, UA: 0.2 U/dL
pH, UA: 6 (ref 5.0–8.0)

## 2024-01-05 MED ORDER — SULFAMETHOXAZOLE-TRIMETHOPRIM 800-160 MG PO TABS: 1.0000 | ORAL_TABLET | Freq: Two times a day (BID) | ORAL | 0 refills | Status: AC

## 2024-01-05 MED ORDER — MUPIROCIN 2 % EX OINT: 1.0000 | TOPICAL_OINTMENT | Freq: Two times a day (BID) | CUTANEOUS | 0 refills | Status: DC

## 2024-01-05 MED ORDER — FLUCONAZOLE 150 MG PO TABS
ORAL_TABLET | ORAL | 0 refills | Status: DC
Start: 2024-01-05 — End: 2024-01-23

## 2024-01-05 NOTE — Discharge Instructions (Addendum)
 Take medication as prescribed. You may take over-the-counter Tylenol or ibuprofen as needed for pain, fever, or general discomfort. Keep the genital area clean and dry.  Avoid excessive sweating or moisture while symptoms persist. Cleanse the genital area with unscented soap while symptoms persist. Recommend avoiding sexual intercourse until symptoms improve. As discussed, if symptoms fail to improve with this treatment, you may follow-up in this clinic or with your primary care physician for further evaluation. Follow-up as needed.

## 2024-01-05 NOTE — ED Provider Notes (Signed)
 [facility]    CSN: [id number] Arrival date & time: 01/05/24  1909      History   Chief Complaint Chief Complaint  Patient presents with   Skin Problem    HPI Dina Mobley is a 33 y.o. male.   The history is provided by the patient.   Reports no concern for STI/STD Past Medical History:  Diagnosis Date   Asthma     There are no active problems to display for this patient.   History reviewed. No pertinent surgical history.     Home Medications    Prior to Admission medications   Medication Sig Start Date End Date Taking? Authorizing Provider  albuterol (PROVENTIL HFA;VENTOLIN HFA) 108 (90 Base) MCG/ACT inhaler Inhale 2 puffs into the lungs every 4 (four) hours as needed for wheezing or shortness of breath.    [provider]  meloxicam  (MOBIC ) 7.5 MG tablet Take 1 tablet (7.5 mg total) by mouth daily. 10/14/21   Penne Knee, MD  tamsulosin  (FLOMAX ) 0.4 MG CAPS capsule Take 1 capsule (0.4 mg total) by mouth daily. 10/14/21   Penne Knee, MD    Family History History reviewed. No pertinent family history.  Social History Social History   Tobacco Use   Smoking status: Never   Smokeless tobacco: Never  Substance Use Topics   Alcohol use: Yes     Allergies   Iodinated contrast media, Other, Doxycycline, and Suprax [cefixime]   Review of Systems Review of Systems Per HPI  Physical Exam Triage Vital Signs ED Triage Vitals  Encounter Vitals Group     BP 01/05/24 1925 (!) 161/95     Girls Systolic BP Percentile --      Girls Diastolic BP Percentile --      Boys Systolic BP Percentile --      Boys Diastolic BP Percentile --      Pulse Rate 01/05/24 1925 (!) 102     Resp 01/05/24 1925 20     Temp 01/05/24 1925 98.6 F (37 C)     Temp Source 01/05/24 1925 Oral     SpO2 01/05/24 1925 96 %     Weight --      Height --      Head Circumference --      Peak Flow --      Pain Score 01/05/24 1923 1     Pain Loc --       Pain Education --      Exclude from Growth Chart --    No data found.  Updated Vital Signs BP (!) 161/95 (BP Location: Right Arm)   Pulse (!) 102   Temp 98.6 F (37 C) (Oral)   Resp 20   SpO2 96%   Visual Acuity Right Eye Distance:   Left Eye Distance:   Bilateral Distance:    Right Eye Near:   Left Eye Near:    Bilateral Near:     Physical Exam Vitals and nursing note reviewed. Exam conducted with a chaperone present Jesusa, Charity fundraiser).  Constitutional:      General: He is not in acute distress.    Appearance: Normal appearance.  Genitourinary:    Penis: Circumcised. Erythema (mild) and lesions present. No tenderness or swelling.     Neurological:     Mental Status: He is alert.      UC Treatments / Results  Labs (all labs ordered are listed, but only abnormal results are displayed) Labs Reviewed  POCT URINE DIPSTICK  EKG   Radiology No results found.  Procedures Procedures (including critical care time)  Medications Ordered in UC Medications - No data to display  Initial Impression / Assessment and Plan / UC Course  I have reviewed the triage vital signs and the nursing notes.  Pertinent labs & imaging results that were available during my care of the patient were reviewed by me and considered in my medical decision making (see chart for details).  Patient with a 2-week history of penile irritation and lesions.  Patient started with symptoms that involve the groin regions but that has since resolved.  Urinalysis is negative for signs of infection.  No penile swelling or discharge present.  Will provide empiric treatment with Bactrim  DS 800/160 mg for skin infection, mupirocin  ointment for patient to apply topically, and fluconazole  150 mg to cover for possible yeast.  Supportive care recommendations were provided and discussed with the patient to include continued use of unscented soaps, avoiding sexual intercourse until symptoms improve, and keeping the  genital area clean and dry.  Patient was given strict follow-up precautions.  Patient was in agreement with this plan of care and verbalizes understanding.  All questions were answered.  Patient stable for discharge.  Final Clinical Impressions(s) / UC Diagnoses   Final diagnoses:  None   Discharge Instructions   None    ED Prescriptions   None    PDMP not reviewed this encounter.

## 2024-01-05 NOTE — ED Triage Notes (Addendum)
 Pt reports chigger bites to inner thigh and penis x2 weeks. Pt reports applied alcohol to sites as needed and used hydrocortisone cream intermittently. Reports redness and irritation to penis ever since, occasional dysuria, but irritation to inner thighs is no longer present. Reports has switched from scented soap to unscented oatmeal based body wash once irritation started.

## 2024-01-22 NOTE — ED Provider Notes (Addendum)
 [facility] Emergency Department Provider Note   ED Clinical Impression   Final diagnoses:  Chronic prostatitis (Primary)    Initial Impression, ED Course, Assessment and Plan   Impression: Left testicular pain.   33 year old male here for several weeks of testicular pain.  He has been seen multiple times in urgent care with various antibiotics trialed with continuation of pain.  He has previous prostatitis and feels this is similar.  He has a pending STI workup but no acute concerns with no discharge.  Order ultrasound to rule out torsion or abscess.  Reports he is allergic to doxycycline, cephalosporins, fluoroquinolones. The patient is suffering from testicular pain, but based on the history, exam, and work up, I do not suspect that the patient has testicular torsion, abscess, severe cellulitis, Fournier's gangrene, orchitis, epididymitis, inguinal hernia or other emergent cause. He has tried Bactrim  for 5 days without relief.  Discussed that treatment for prostatitis is typically a longer course, he is willing to try with outpatient urology follow up.   ED Course as of 01/22/24 1909  Mon Jan 22, 2024  1325 Chlamydia/Gonorrhoeae NAA:   Chlamydia trachomatis, NAA Not Detected  Gonorrhoeae NAA Not Detected Negative G/C  1325 Urinalysis with Microscopy with Culture Reflex (Clean Catch)(!):   Color, UA Light Yellow  Clarity, UA Clear  Spec Grav, UA 1.024  pH, UA 5.5  Leukocyte Esterase, UA Negative  Nitrite, UA Negative  Protein, UA Negative  Glucose, UA Negative  Ketones, UA Negative  Urobilinogen, UA <2.0 mg/dL  Bilirubin, UA Negative  Blood, UA Negative  RBC, UA <1  WBC, UA <1  Squam Epithel, UA <1  Bacteria, UA Rare(!)  Sperm, UA Rare(!)  Mucus, UA Rare(!)  1429 IMPRESSION: Small bilateral hydroceles with internal debris. Otherwise unremarkable scrotal ultrasound.   1429 Urinalysis with Microscopy with Culture Reflex (Clean Catch)(!):   Color, UA  Light Yellow  Clarity, UA Clear  Spec Grav, UA 1.024  pH, UA 5.5  Leukocyte Esterase, UA Negative  Nitrite, UA Negative  Protein, UA Negative  Glucose, UA Negative  Ketones, UA Negative  Urobilinogen, UA <2.0 mg/dL  Bilirubin, UA Negative  Blood, UA Negative  RBC, UA <1  WBC, UA <1  Squam Epithel, UA <1  Bacteria, UA Rare(!)  Sperm, UA Rare(!)  Mucus, UA Rare(!)     Additional Medical Decision Making   I independently visualized the radiology images.  I reviewed the patient's prior medical records.   Labs and radiology results that were available during my care of the patient were independently reviewed by me and considered in my medical decision making.  Portions of this record have been created using Scientist, clinical (histocompatibility and immunogenetics). Dictation errors have been sought, but may not have been identified and corrected. ____________________________________________  I have reviewed the triage vital signs and the nursing notes.   History   Chief Complaint Testicle Pain   HPI  History of Present Illness Harold Willis. is a 33 year old male with a history of prostatitis who presents with a persistent genital rash and testicular pain.  Approximately six weeks ago, he developed lesions resembling chigger bites' on the inside of his thigh and the top of his penis. He applied rubbing alcohol, which usually resolves similar issues within a few days, but a red, shiny, and irritated patch developed, spreading to the front area and causing dysuria.  He initially sought care at an urgent care facility and was prescribed medications for a urinary tract  infection and yeast infection, along with an ointment. Despite completing the course during a vacation, his symptoms persisted. At a subsequent urgent care visit, he was prescribed clindamycin, which he discontinued after five days due to lack of improvement.  He has a history of prostatitis and began experiencing symptoms suggestive of its  recurrence. On a third urgent care visit, he was prescribed nystatin cream and Cipro, but developed a rash on his face and chest after one dose, prompting discontinuation. He is allergic to doxycycline and possibly fluoroquinolones.  He reports testicular pain, primarily in the left testicle, which began around Thursday or Friday last week. He has not had an ultrasound yet. No recent viral illnesses and no discharge. He underwent hypospadias surgery as a child, resulting in some scar tissue on the penis.  He is sexually active with women and has recently undergone testing for sexually transmitted infections, with results pending. He is concerned about the progression of his symptoms and is seeking further evaluation.     Past Medical History[1]  Problem List[2]  Past Surgical History[3]  No current facility-administered medications for this encounter.  Current Outpatient Medications:  .  albuterol (PROVENTIL HFA;VENTOLIN HFA) 90 mcg/actuation inhaler, Inhale., Disp: , Rfl:  .  BREO ELLIPTA 200-25 mcg/dose inhaler, , Disp: , Rfl:  .  cetirizine (ZYRTEC) 5 MG chewable tablet, Chew 1 tablet (5 mg total) daily., Disp: , Rfl:  .  EPINEPHrine (EPIPEN) 0.3 mg/0.3 mL injection, 0.3 mg. (Patient not taking: Reported on 03/03/2021), Disp: , Rfl:  .  fluticasone (FLONASE) 50 mcg/actuation nasal spray, , Disp: , Rfl:  .  ipratropium (ATROVENT) 0.02 % nebulizer solution, , Disp: , Rfl:  .  montelukast (SINGULAIR) 10 mg tablet, Take 10 mg by mouth daily., Disp: , Rfl:  .  sodium,potassium,mag sulfates 17.5-3.13-1.6 gram SolR, Take 1 Bottle by mouth., Disp: , Rfl:  .  sulfamethoxazole -trimethoprim  (BACTRIM  DS) 800-160 mg per tablet, Take 1 tablet (160 mg of trimethoprim  total) by mouth two (2) times a day for 14 days., Disp: 28 tablet, Rfl: 0  Allergies Doxycycline, Iodinated contrast media, Omnipaque [iohexol], Paprika, and Suprax [cefixime]  Family History[4]  Social History Short Social  History[5]  Review of Systems Constitutional: Negative for fever. Cardiovascular: Negative for chest pain. Respiratory: Negative for shortness of breath. Gastrointestinal: Negative for abdominal pain, vomiting or diarrhea. Genitourinary: Negative for dysuria and penile discharge.  Musculoskeletal: Negative for back pain. Skin: Negative for rash. Neurological: Negative for headaches, focal weakness or numbness.  Physical Exam   ED Triage Vitals [01/22/24 1124]  Enc Vitals Group     BP 102/90     Pulse 88     SpO2 Pulse      Resp 16     Temp 36.7 C (98 F)     Temp src      SpO2 95 %     Weight (!) 108.9 kg (240 lb)     Height      Head Circumference      Peak Flow      Pain Score      Pain Loc      Pain Education      Exclude from Growth Chart    Constitutional: Alert and oriented. Well appearing and in no distress. Eyes: Conjunctivae are normal. ENT      Head: Normocephalic and atraumatic.      Nose: No congestion.      Mouth/Throat: Mucous membranes are moist.      Neck: No  stridor. Hematological/Lymphatic/Immunilogical: No cervical lymphadenopathy. Cardiovascular: Normal rate, regular rhythm. Normal and symmetric distal pulses are present in all extremities. Respiratory: Normal respiratory effort. Breath sounds are normal. Gastrointestinal: Soft and nontender. There is no CVA tenderness. No palpable inguinal hernia.  Genitourinary: Penis with well healed very small lesion not resembling a chancre. Hx of hypospadias with surgical scar from repair. No rash. Testes descended bilaterally, not edematous, no palpable masses, pain not relieved by elevation, cremasteric reflex intact bilaterally.  Musculoskeletal: Normal range of motion in all extremities.      Right lower leg: No tenderness or edema.      Left lower leg: No tenderness or edema. Neurologic: Normal speech and language. No gross focal neurologic deficits are appreciated. Skin: Skin is warm, dry and intact. No  rash noted. Psychiatric: Mood and affect are normal. Speech and behavior are normal.   EKG    Radiology   Procedures   Procedure(s) performed: None.       Karolee Channie PARAS, MD Resident 01/22/24 1907       [1] Past Medical History: Diagnosis Date  . Asthma ([facility])   [2] Patient Active Problem List Diagnosis  . Chronic prostatitis  . Orchalgia  . History of corrected hypospadias  . Incomplete bladder emptying  [3] No past surgical history on file. [4] No family history on file. [5] Social History Tobacco Use  . Smoking status: Never  . Smokeless tobacco: Never  Vaping Use  . Vaping status: Never Used  Substance Use Topics  . Alcohol use: No  . Drug use: Yes    Types: Marijuana   Karolee Channie PARAS, MD Resident 01/22/24 [phone]

## 2024-01-22 NOTE — ED Triage Notes (Signed)
 Pt  presents here with pain, swelling and tightness to left testicle and burning urination from Thursday. Denies penile discharge. He is also concerned about STI. Mentions Bump to penis and thigh a month ago and was treated with antibiotic.

## 2024-01-23 ENCOUNTER — Ambulatory Visit: Admitting: Urology

## 2024-01-23 VITALS — BP 131/86 | HR 64 | Ht 69.0 in | Wt 248.0 lb

## 2024-01-23 DIAGNOSIS — R102 Pelvic and perineal pain: Secondary | ICD-10-CM

## 2024-01-23 MED ORDER — AMOXICILLIN-POT CLAVULANATE 875-125 MG PO TABS: 1.0000 | ORAL_TABLET | Freq: Two times a day (BID) | ORAL | 0 refills | Status: AC

## 2024-01-23 MED ORDER — CELECOXIB 200 MG PO CAPS: 200.0000 mg | ORAL_CAPSULE | Freq: Two times a day (BID) | ORAL | 0 refills | Status: AC

## 2024-01-23 NOTE — Progress Notes (Signed)
   01/23/2024 3:43 PM   Harold Willis 06/29/1990 [id number]  Reason for visit: Left scrotal and groin pain, penile lesion  History: Previously seen by Dr. Ike and Dr. Penne for chronic prostatitis/pelvic floor dysfunction Reports 6 weeks of bothersome symptoms, has been seen at multiple urgent care/ER  Physical Exam: BP 131/86 (BP Location: Left Arm, Patient Position: Sitting, Cuff Size: Large)   Pulse 64   Ht 5' 9 (1.753 m)   Wt 248 lb (112.5 kg)   SpO2 96%   BMI 36.62 kg/m  Phallus with patent meatus, no significant erythema or abnormalities, testicles 20 cc and descended bilaterally without masses, left testicle mildly tender, mild left groin tenderness  Imaging/labs: I personally viewed interpreted the scrotal ultrasound 01/22/24 with no significant abnormalities, small bilateral hydroceles  Today: Reports 6 weeks of pelvic pain, pressure, left groin pain Feels like this correlated with a penile rash Rash has improved Has taken multiple antibiotics including Bactrim , clindamycin, and Cipro.  Thinks he may have had some mild improvement after the Cipro but only took 1 dose secondary to reaction of hives/rash Most recently was seen in the ER yesterday at [facility] and they recommended Mycolog cream and Bactrim  Denies any gross hematuria, G/C testing in ER negative, full STD panel sent at outside urgent care and pending I reviewed the outside notes from urgent care/ER  Plan:   We reviewed possible etiologies including pelvic floor dysfunction, chronic prostatitis, urethritis.  His presentation is more consistent with pelvic floor dysfunction, but cannot rule out prostatitis.  Reassurance provided regarding normal urinalysis, negative cultures, normal scrotal ultrasound.  He would like to try different antibiotic than Bactrim  which he took before without significant improvement Augmentin  x 10 days, Celebrex  x 10 days Pelvic floor stretching exercises provided Return precautions  discussed, he will contact us  via MyChart if no improvement in the next few days   Harold JAYSON Burnet, MD  [facility] [address], [address] [place], [place] [address] [phone]

## 2024-01-23 NOTE — Patient Instructions (Signed)
 Harold Willis

## 2024-01-26 ENCOUNTER — Ambulatory Visit: Admitting: Urology

## 2024-02-05 ENCOUNTER — Telehealth: Payer: Self-pay

## 2024-02-05 NOTE — Telephone Encounter (Signed)
 LM informing pt that the 9/16 appointment will be cancelled.   Sent my chart message as well.

## 2024-02-05 NOTE — Telephone Encounter (Signed)
-----   Message from Redell JAYSON Burnet sent at 02/02/2024  8:08 AM EDT ----- Regarding: cancel appt Yes, please cancel his appointment.  We have nothing else to offer him.  He also got a second opinion from an outside urologist last week who also agreed with our recommendations for pelvic floor physical therapy.  Okay to place pelvic floor physical therapy referral if an additional one is needed, it looks like he already has 1 through the [facility] system  Redell Burnet, MD 02/02/2024 ----- Message ----- From: Elouise Gaba, CMA Sent: 02/01/2024   4:08 PM EDT To: Redell JAYSON Burnet, MD  This pt is on our scheduled for next week I don't think he needs to be seen let me know if you want me to have him canceled.   -OG

## 2024-02-06 ENCOUNTER — Ambulatory Visit: Admitting: Urology

## 2024-05-08 ENCOUNTER — Ambulatory Visit: Payer: Self-pay

## 2024-05-08 NOTE — Telephone Encounter (Signed)
 FYI Only or Action Required?: FYI only for provider: appointment scheduled on 06/21/23.  Patient was last seen in primary care on NP.  Called Nurse Triage reporting Arm Swelling.  Symptoms began several days ago.  Interventions attempted: Nothing.  Symptoms are: stable.  Triage Disposition: See Physician Within 24 Hours  Patient/caregiver understands and will follow disposition?: Yes    Copied from CRM #[id number]. Topic: Clinical - Red Word Triage >> May 08, 2024  9:54 AM Mesmerise C wrote: [facility] that prompted transfer to Nurse Triage: patient has had swelling in his armpits been ongoing since summer went away and happening again, also states there's pain to it as well started appearing a couple days ago Reason for Disposition  [1] Swelling is painful to touch AND [2] no fever  Answer Assessment - Initial Assessment Questions Pt's spouse notes this is an issue pt began experiencing over the summer, he saw current PCP and several other providers and issues resolved. She notes same symptoms have returned. NP visit scheduled as he is looking to establish with new provider. Advised UC/current PCP for eval/treatment today. Pt's spouse verbalizes understanding and agrees to plan. This RN educated pt on home care, new-worsening symptoms, when to call back/seek emergent care. Pt verbalized understanding and agrees to plan.     1. APPEARANCE of SWELLING: What does it look like?     Swollen bumps 2. SIZE: How large is the swelling? (e.g., inches, cm; or compare to size of pinhead, tip of pen, eraser, coin, pea, grape, ping pong ball)      2 bumps size of a pea/bean 3. LOCATION: Where is the swelling located?     Left armpit 4. ONSET: When did the swelling start?     Began in July, resolved and has since returned 5. COLOR: What color is it? Is there more than one color?     Normal 6. PAIN: Is there any pain? If Yes, ask: How bad is the pain? (Scale 1-10; or mild,  moderate, severe)       Mod-Severe, intermittent 7. ITCH: Does it itch? If Yes, ask: How bad is the itch?      Yes 8. CAUSE: What do you think caused the swelling?     Unknown 9 OTHER SYMPTOMS: Do you have any other symptoms? (e.g., fever)     Spouse reports abrasion to penis that starts when he develops the armpit swelling, pt was treated over summer for this, just re occurring now in addition to the swelling  Protocols used: Skin Lump or Localized Swelling-A-AH

## 2024-06-20 ENCOUNTER — Ambulatory Visit: Admitting: Family Medicine
